# Patient Record
Sex: Male | Born: 1940
Health system: Southern US, Community
[De-identification: ages and names within clinical notes are randomized; demographics above are authoritative.]

## PROBLEM LIST (undated history)

## (undated) DIAGNOSIS — E119 Type 2 diabetes mellitus without complications: Secondary | ICD-10-CM

## (undated) DIAGNOSIS — Z720 Tobacco use: Secondary | ICD-10-CM

## (undated) DIAGNOSIS — E785 Hyperlipidemia, unspecified: Secondary | ICD-10-CM

## (undated) DIAGNOSIS — I219 Acute myocardial infarction, unspecified: Secondary | ICD-10-CM

## (undated) DIAGNOSIS — I739 Peripheral vascular disease, unspecified: Secondary | ICD-10-CM

## (undated) DIAGNOSIS — R1310 Dysphagia, unspecified: Secondary | ICD-10-CM

## (undated) DIAGNOSIS — I639 Cerebral infarction, unspecified: Secondary | ICD-10-CM

## (undated) DIAGNOSIS — I10 Essential (primary) hypertension: Secondary | ICD-10-CM

## (undated) HISTORY — DX: Tobacco use: Z72.0

## (undated) HISTORY — DX: Essential (primary) hypertension: I10

## (undated) HISTORY — PX: US ECHOCARDIOGRAPHY: HXRAD669

## (undated) HISTORY — DX: Peripheral vascular disease, unspecified: I73.9

## (undated) HISTORY — PX: MRI: SHX5353

## (undated) HISTORY — DX: Hyperlipidemia, unspecified: E78.5

## (undated) HISTORY — DX: Type 2 diabetes mellitus without complications: E11.9

## (undated) HISTORY — DX: Dysphagia, unspecified: R13.10

## (undated) HISTORY — DX: Cerebral infarction, unspecified: I63.9

---

## 1998-08-27 ENCOUNTER — Ambulatory Visit (HOSPITAL_COMMUNITY): Admission: RE | Admit: 1998-08-27 | Discharge: 1998-08-27 | Payer: Self-pay | Admitting: Cardiovascular Disease

## 1998-08-27 ENCOUNTER — Encounter: Payer: Self-pay | Admitting: Cardiovascular Disease

## 1998-08-31 ENCOUNTER — Ambulatory Visit: Admission: RE | Admit: 1998-08-31 | Discharge: 1998-08-31 | Payer: Self-pay | Admitting: Cardiovascular Disease

## 1998-10-16 ENCOUNTER — Encounter: Payer: Self-pay | Admitting: Cardiovascular Disease

## 1998-10-16 ENCOUNTER — Inpatient Hospital Stay (HOSPITAL_COMMUNITY): Admission: RE | Admit: 1998-10-16 | Discharge: 1998-10-17 | Payer: Self-pay | Admitting: Cardiovascular Disease

## 1998-10-22 ENCOUNTER — Ambulatory Visit (HOSPITAL_COMMUNITY): Admission: RE | Admit: 1998-10-22 | Discharge: 1998-10-22 | Payer: Self-pay | Admitting: Internal Medicine

## 1998-10-22 ENCOUNTER — Encounter: Payer: Self-pay | Admitting: Internal Medicine

## 1999-08-08 ENCOUNTER — Emergency Department (HOSPITAL_COMMUNITY): Admission: EM | Admit: 1999-08-08 | Discharge: 1999-08-08 | Payer: Self-pay | Admitting: Emergency Medicine

## 2000-07-18 ENCOUNTER — Encounter: Payer: Self-pay | Admitting: Emergency Medicine

## 2000-07-18 ENCOUNTER — Emergency Department (HOSPITAL_COMMUNITY): Admission: EM | Admit: 2000-07-18 | Discharge: 2000-07-18 | Payer: Self-pay | Admitting: Emergency Medicine

## 2000-08-04 ENCOUNTER — Encounter: Admission: RE | Admit: 2000-08-04 | Discharge: 2000-08-04 | Payer: Self-pay | Admitting: Internal Medicine

## 2002-03-22 ENCOUNTER — Emergency Department (HOSPITAL_COMMUNITY): Admission: EM | Admit: 2002-03-22 | Discharge: 2002-03-22 | Payer: Self-pay | Admitting: Emergency Medicine

## 2005-09-15 ENCOUNTER — Emergency Department (HOSPITAL_COMMUNITY): Admission: EM | Admit: 2005-09-15 | Discharge: 2005-09-15 | Payer: Self-pay | Admitting: Emergency Medicine

## 2005-11-13 ENCOUNTER — Ambulatory Visit (HOSPITAL_BASED_OUTPATIENT_CLINIC_OR_DEPARTMENT_OTHER): Admission: RE | Admit: 2005-11-13 | Discharge: 2005-11-13 | Payer: Self-pay | Admitting: Specialist

## 2008-09-26 DEATH — deceased

## 2010-06-22 ENCOUNTER — Emergency Department (HOSPITAL_COMMUNITY): Payer: Medicare Other

## 2010-06-22 ENCOUNTER — Inpatient Hospital Stay (HOSPITAL_COMMUNITY)
Admission: EM | Admit: 2010-06-22 | Discharge: 2010-06-24 | DRG: 066 | Discharge status: Against Medical Advice | Payer: Medicare Other | Attending: Internal Medicine | Admitting: Internal Medicine

## 2010-06-22 DIAGNOSIS — R29810 Facial weakness: Secondary | ICD-10-CM | POA: Diagnosis present

## 2010-06-22 DIAGNOSIS — R1310 Dysphagia, unspecified: Secondary | ICD-10-CM | POA: Diagnosis present

## 2010-06-22 DIAGNOSIS — I6350 Cerebral infarction due to unspecified occlusion or stenosis of unspecified cerebral artery: Principal | ICD-10-CM | POA: Diagnosis present

## 2010-06-22 DIAGNOSIS — E119 Type 2 diabetes mellitus without complications: Secondary | ICD-10-CM | POA: Diagnosis present

## 2010-06-22 DIAGNOSIS — F17200 Nicotine dependence, unspecified, uncomplicated: Secondary | ICD-10-CM | POA: Diagnosis present

## 2010-06-22 DIAGNOSIS — I10 Essential (primary) hypertension: Secondary | ICD-10-CM | POA: Diagnosis present

## 2010-06-22 DIAGNOSIS — Z7902 Long term (current) use of antithrombotics/antiplatelets: Secondary | ICD-10-CM

## 2010-06-22 DIAGNOSIS — I739 Peripheral vascular disease, unspecified: Secondary | ICD-10-CM | POA: Diagnosis present

## 2010-06-22 DIAGNOSIS — E785 Hyperlipidemia, unspecified: Secondary | ICD-10-CM | POA: Diagnosis present

## 2010-06-22 LAB — DIFFERENTIAL
Eosinophils Relative: 7 % — ABNORMAL HIGH (ref 0–5)
Lymphocytes Relative: 27 % (ref 12–46)

## 2010-06-22 LAB — BASIC METABOLIC PANEL
CO2: 27 mEq/L (ref 19–32)
Calcium: 10.1 mg/dL (ref 8.4–10.5)
Chloride: 103 mEq/L (ref 96–112)
Creatinine, Ser: 1.19 mg/dL (ref 0.4–1.5)
Potassium: 3.4 mEq/L — ABNORMAL LOW (ref 3.5–5.1)
Sodium: 140 mEq/L (ref 135–145)

## 2010-06-22 LAB — CBC
HCT: 47 % (ref 39.0–52.0)
Hemoglobin: 16.3 g/dL (ref 13.0–17.0)
MCHC: 34.7 g/dL (ref 30.0–36.0)
MCV: 82.1 fL (ref 78.0–100.0)
Platelets: 302 10*3/uL (ref 150–400)
RBC: 5.75 MIL/uL (ref 4.22–5.81)
RBC: 5.72 MIL/uL (ref 4.22–5.81)
RDW: 14.3 % (ref 11.5–15.5)
RDW: 14.3 % (ref 11.5–15.5)
WBC: 13.8 10*3/uL — ABNORMAL HIGH (ref 4.0–10.5)

## 2010-06-22 LAB — POCT CARDIAC MARKERS
CKMB, poc: 2.4 ng/mL (ref 1.0–8.0)
Myoglobin, poc: 148 ng/mL (ref 12–200)
Troponin i, poc: <0.05 ng/mL (ref 0.00–0.09)

## 2010-06-22 LAB — COMPREHENSIVE METABOLIC PANEL
ALT: 30 U/L (ref 0–53)
Alkaline Phosphatase: 45 U/L (ref 39–117)
Calcium: 9.7 mg/dL (ref 8.4–10.5)
Chloride: 102 mEq/L (ref 96–112)
Glucose, Bld: 109 mg/dL — ABNORMAL HIGH (ref 70–99)
Sodium: 137 mEq/L (ref 135–145)
Total Bilirubin: 0.5 mg/dL (ref 0.3–1.2)
Total Protein: 7.2 g/dL (ref 6.0–8.3)

## 2010-06-22 LAB — CK TOTAL AND CKMB (NOT AT ARMC)
Relative Index: 1.8 (ref 0.0–2.5)
Total CK: 148 U/L (ref 7–232)

## 2010-06-22 LAB — APTT: aPTT: 29 seconds (ref 24–37)

## 2010-06-23 ENCOUNTER — Inpatient Hospital Stay (HOSPITAL_COMMUNITY): Payer: Medicare Other

## 2010-06-23 LAB — LIPID PANEL
Cholesterol: 172 mg/dL (ref 0–200)
LDL Cholesterol: 87 mg/dL (ref 0–99)
Triglycerides: 308 mg/dL — ABNORMAL HIGH (ref <150)
VLDL: 62 mg/dL — ABNORMAL HIGH (ref 0–40)

## 2010-06-23 LAB — BASIC METABOLIC PANEL
Calcium: 9 mg/dL (ref 8.4–10.5)
Chloride: 103 mEq/L (ref 96–112)
GFR calc Af Amer: >60 mL/min (ref >60)
GFR calc non Af Amer: 58 mL/min — ABNORMAL LOW (ref >60)

## 2010-06-23 LAB — GLUCOSE, CAPILLARY
Glucose-Capillary: 237 mg/dL — ABNORMAL HIGH (ref 70–99)
Glucose-Capillary: 126 mg/dL — ABNORMAL HIGH (ref 70–99)
Glucose-Capillary: 124 mg/dL — ABNORMAL HIGH (ref 70–99)

## 2010-06-24 LAB — CBC
HCT: 46.9 % (ref 39.0–52.0)
Hemoglobin: 16 g/dL (ref 13.0–17.0)
MCH: 28.5 pg (ref 26.0–34.0)
MCHC: 34.1 g/dL (ref 30.0–36.0)
RBC: 5.62 MIL/uL (ref 4.22–5.81)

## 2010-06-24 LAB — BASIC METABOLIC PANEL
CO2: 25 mEq/L (ref 19–32)
Calcium: 9.4 mg/dL (ref 8.4–10.5)
Chloride: 105 mEq/L (ref 96–112)
Glucose, Bld: 149 mg/dL — ABNORMAL HIGH (ref 70–99)
Sodium: 137 mEq/L (ref 135–145)

## 2010-06-24 LAB — GLUCOSE, CAPILLARY: Glucose-Capillary: 153 mg/dL — ABNORMAL HIGH (ref 70–99)

## 2010-06-29 NOTE — Consult Note (Signed)
NAME:  Jared Young, Jared Young NO.:  [id number]  MEDICAL RECORD NO.:  [id number]           PATIENT TYPE:  I  LOCATION:  3015                         FACILITY:  [facility]  PHYSICIAN:  Thana Farr, MD    DATE OF BIRTH:  [birth year]  DATE OF CONSULTATION:  06/22/2010 DATE OF DISCHARGE:                                CONSULTATION   Consult called by Dr. Butler Denmark.  HISTORY:  Mr. Tones is a 70 year old male who reports awakening this morning and noting some slurred speech and left facial droop.  Also noted some funny sensation on the right side of his tongue and throat. He currently denies any right-sided symptoms and denied ever having them earlier today as well.  He reports that he was able to eat his breakfast and some lunch, but did note that things were leaking out of his mouth. He does not describe any numbness or weakness in the extremities.  The patient presented this afternoon.  Workup included an MRI of the brain that showed an acute frontoparietal cortical infarct.  MRA showed distal diffuse atherosclerosis.  Consult called for further recommendations. Patient is not considered a tPA candidate at this time due to time of onset.  PAST MEDICAL HISTORY:  Hypertension, hypercholesterolemia, peripheral vascular disease, diabetes.  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  He reports no history of alcohol or illicit drug abuse.  He is a smoker.  MEDICATIONS:  Acarbose, amlodipine, fenofibrate, fish oil, glimepiride, losartan, hydrochlorothiazide, metformin, and aspirin.  PHYSICAL EXAMINATION:  VITAL SIGNS:  Blood pressure 143/89, heart rate 68, respiratory rate 16, temperature 97.9. NEUROLOGIC:  On mental status testing, the patient is alert and oriented.  He can follow commands without difficulty.  Speech is dysarthric but fluent.  On cranial nerve testing II, disk flat bilaterally.  Visual fields are grossly intact.  III, IV, VI, extraocular  movements intact.  V and VII, left facial droop.  VIII, grossly intact.  IX and X decreased gag.  XI, bilateral shoulder shrug. XII, midline tongue extension.  On motor exam, the patient has a 5-/5 hand grip on the left and is 5-/5 on the left upper extremity diffusely. He is 5/5 on the right.  There is a pronator drift on the left.  The patient is 5/5 in the bilateral lower extremities.  On sensory testing, the patient reports intact pinprick and light touch bilaterally, but does seem to preferentially use the right as opposed to the left even though the patient reports that he is left handed.  There does seem to be some mild neglect.  Deep tendon reflexes are 1+ in the upper extremities, trace at the knees, and absent at the ankles.  Plantar is upgoing on the left and downgoing on the right.  On cerebellar testing, finger-to-nose and heel-to-shin intact.  LABORATORY DATA:  White blood cell count 13.8, platelet count 302, hemoglobin and hematocrit 16.3 and 47.0 respectively.  PT 13.6, INR 1.02, PTT 29.  Sodium 137, potassium 3.2, chloride 102, CO2 of 24, glucose 109, BUN 21, creatinine 1.17, bilirubin 0.5, alk phos 45, SGOT and  SGPT 23 and 30, total protein 7.2, albumin 3.9, calcium 9.7, hemoglobin A1c 7.7.  Troponin negative.  ASSESSMENT:  Mr. Bonaventure is a 70 year old male who has had an acute left right cortical infarction likely secondary to small vessel disease.  There does not seem to be any large vessel disease that would warrant aggressive intervention.  He is already taking an aspirin a day.  Echocardiogram and carotids are pending.  PLAN: 1. I agree with echo and carotids. 2. Would change aspirin to Plavix 75 mg p.o. daily. 3. A swallow eval. 4. May benefit from OT evaluation as well.          ______________________________ Thana Farr, MD     LR/MEDQ  D:  06/22/2010  T:  06/23/2010  Job:  [id number]  Electronically Signed by Thana Farr MD on 06/29/2010 01:46:24  PM

## 2010-07-04 NOTE — H&P (Signed)
NAME:  Jared Young, Jared Young NO.:  [id number]  MEDICAL RECORD NO.:  [id number]           PATIENT TYPE:  E  LOCATION:  [facility]                         FACILITY:  [facility]  PHYSICIAN:  Calvert Cantor, M.D.     DATE OF BIRTH:  1941-02-24  DATE OF ADMISSION:  06/22/2010 DATE OF DISCHARGE:                             HISTORY & PHYSICAL   PRIMARY CARE PHYSICIAN:  Georgianne Fick, MD  PRESENTING COMPLAINT:  Slurred speech.  HISTORY OF PRESENT ILLNESS:  This is a 70 year old male with hypertension, hyperlipidemia, diabetes, and peripheral vascular disease who is also a smoker.  The patient woke up this morning and noted slurred speech, left facial droop and a funny sensation on the right side of his tongue and his throat.  He was able to eat his breakfast, the only issue was that it was leaking out of his mouth.  The patient does not describe any other neurological symptoms in addition to these. He had no trouble with his vision and no trouble ambulating.  PAST MEDICAL HISTORY: 1. Hypertension. 2. High cholesterol. 3. Peripheral vascular disease. 4. Non-insulin dependent diabetes mellitus. 5. Smoker.  PAST SURGICAL HISTORY: 1. Stents in both legs in 2000. 2. Back surgery.  SOCIAL HISTORY:  Smokes a pack a day, has been smoking since age 73, does not drink.  He drive a delivery truck.  He is married.  No drug abuse.  ALLERGIES:  He is unable to tolerate statins because pain in his legs.  MEDICATIONS:  Still awaiting med rec, but according to the ER list he is on the following; 1. Acarbose. 2. Amlodipine. 3. Fenofibrate. 4. Fish oil. 5. Glimepiride. 6. Losartan/HCTZ. 7. Metformin.  When asked about aspirin, he admits that he takes a full-dose aspirin daily.  REVIEW OF SYSTEMS:  The patient has been dieting these past 3 weeks and has lost about 15-16 pounds.  No lethargy.  No fever, chills, or sweats. No complaint of frequent headaches.  No blurred vision or  double vision. No sore throat.  He has lot of sinus drainage.  No earache. RESPIRATORY:  No cough or shortness of breath.  CARDIAC:  No chest pain, palpitations, or pedal edema.  GASTROINTESTINAL:  No nausea, vomiting, diarrhea, or constipation.  GENITOURINARY:  No dysuria or hematuria. HEMATOLOGIC:  Bruises easily.  SKIN:  No rash.  MUSCULOSKELETAL:  No joint pain or back pain.  NEUROLOGIC:  Per HPI.  No history of stroke or seizure in the past.  PSYCHOLOGIC:  No anxiety or depression.  PHYSICAL EXAMINATION:  GENERAL:  Elderly man sitting up in bed in no acute distress. VITAL SIGNS:  Blood pressure 116/66, pulse 83, respiratory rate 20, temperature 97.8, and oxygen saturation 99%.  No rales. HEENT:  Pupils equal, round, and reacting to light.  Extraocular movements are intact.  Conjunctivae is pink.  No scleral icterus.  Oral mucosa is moist.  Oropharynx is clear. NECK:  Supple.  No thyromegaly, lymphadenopathy, or carotid bruits. HEART:  Regular rate and rhythm.  No murmurs, rubs, or gallops. LUNGS:  Clear bilaterally.  Normal respiratory effort.  No use of  accessory muscles. ABDOMEN:  Soft, nontender, nondistended.  Bowel sounds positive.  No organomegaly. EXTREMITIES:  No cyanosis, clubbing, or edema.  Pedal pulses are not palpable. NEUROLOGIC:  The patient has a left facial droop.  He has normal sensation on both sides of his face.  He is able to elevate his palate appropriately and move his tongue appropriately.  All other cranial nerves are intact.  Strength is 5/5 in all 4 extremities. PSYCHOLOGIC:  Awake, alert, and oriented x3.  Mood and affect normal. SKIN:  Warm and dry.  No rash, but he does have bruises.  LABORATORY DATA:  WBC count, slightly elevated at 12.3.  First set of cardiac enzymes negative.  Metabolic panel reveals a mildly low potassium at 3.4 and glucose is slightly elevated at 106.  IMAGING DATA: 1. Chest x-ray does not reveal any acute pulmonary  abnormalities.     There is multilevel disk space narrowing and spur formation.  There     is mild coarse interstitial markings bilaterally suggestive of     COPD. 2. CT of the head without contrast reveals an old lacunar infarct     involving the right caudate nucleus and mild chronic small vessel     disease. 3. EKG reveals a sinus rhythm at 72 beats per minute with right axis     deviation.  ASSESSMENT/PLAN: 1. Cerebrovascular accident with left facial droop, slurred speech,     and paresthesias on the right side of his tongue and throat.  He     tells me he was able to swallow okay, but will get proper speech     eval.  We will get an MRI of his brain, carotid Dopplers, 2-D echo,     check his lipids, and A1c.  I will switch his aspirin to Plavix.     The ER doctor, Dr. Dierdre Highman has already spoken with Dr. Thad Ranger, the     neurologist on call who will come and evaluate him as well. 2. Smoker.  He has been counseled to quit.  We will order a nicotine     patch for him. 3. Hypertension.  We will place holding parameters on his medications. 4. Hyperlipidemia. 5. Diabetes mellitus. 6. Peripheral vascular disease.  The patient would like to be a no code.  Time on admission was 60 minutes.     Calvert Cantor, M.D.     SR/MEDQ  D:  06/22/2010  T:  06/22/2010  Job:  [id number]  cc:   Georgianne Fick, M.D.  Electronically Signed by Calvert Cantor M.D. on 07/03/2010 12:23:19 PM

## 2010-07-10 NOTE — Discharge Summary (Signed)
NAME:  KHYRE, GERMOND NO.:  [id number]  MEDICAL RECORD NO.:  [id number]           PATIENT TYPE:  I  LOCATION:  3015                         FACILITY:  [facility]  PHYSICIAN:  Thad Ranger, MD       DATE OF BIRTH:  [birth year]  DATE OF ADMISSION:  06/22/2010 DATE OF DISCHARGE:  06/24/2010                              DISCHARGE SUMMARY   PLEASE NOTE THAT THE PATIENT LEFT AGAINST MEDICAL ADVICE ON June 24, 2010.  DISCHARGE DIAGNOSES: 1. Acute right frontoparietal cortical infarction/cerebrovascular     accident. 2. Dysphagia severe secondary to the stroke. 3. Nicotine abuse. 4. Hypertension. 5. Hyperlipidemia. 6. Diabetes mellitus. 7. Peripheral vascular disease.  CONSULTATIONS:  Neurology, Dr. Pearlean Brownie.  BRIEF HISTORY OF PRESENT ILLNESS:  Mr. Nan is a 70 year old male with hypertension, hyperlipidemia, diabetes, peripheral vascular disease, nicotine dependence, woke up on the day of admission and noted slurred speech, left facial droop, and funny sensation on the right side of the tongue and throat.  He was able to eat his breakfast; however, it was leaking out of his mouth.  The patient presented for further workup to the [facility].  RADIOLOGICAL DATA:  Chest x-ray, June 22, 2010, no acute cardiopulmonary abnormalities.  CT of head without contrast, June 22, 2010, no acute intracranial abnormality stable, mild chronic small- vessel disease and old right caudate lacunar.  MRI of the head showed acute infarction in the right frontoparietal cortical region.  No signs of hemorrhage or significant swelling/mass effect.  MRA of the brain showed no major vessel occlusion of critical proximal stenosis, atherosclerotic, irregularity of the more distal intracranial branch vessels diffusely.  BRIEF HOSPITALIZATION COURSE:  Mr. Amezcua is a 70 year old male with a history of hypertension, hyperlipidemia, peripheral vascular disease, diabetes  mellitus, nicotine dependence, presented with slurring of speech with left facial droop.  The patient was found to have acute CVA in the right frontoparietal region.  Neurology was consulted and the patient was placed on stroke pathway.  MRI and MRA of his brain was done, which confirmed the acute right frontoparietal CVA.  Since he was on aspirin, he was started on Plavix per Neurology recommendation as well as the statins.  He also had severe dysphagia secondary to the acute stroke and per speech evaluation and study, the patient qualified for dysphagia I diet with nectar-thick liquids.  The patient was extremely irritated with the dysphagia diet.  At my extensive counseling, he did undergo echocardiogram, which showed EF of 55-65%, possibility of regional wall motion abnormality cannot be excluded, grade 1 diastolic dysfunction.  No cardiac source of emboli was identified.  The patient was seen by Neurology Service and also did recommend TEE.  The patient refused to stay in the hospital for any further testing, although we did recommend him strongly to undergo carotid Doppler and repeat evaluation from speech therapy.  The patient stated that he did not agree with the dysphagia I diet.  I counseled him strongly that if he does not comply with his diet, he can have the pneumonia secondary to aspiration.  The patient was  also strongly counseled to quit smoking.  The patient appears to be extremely noncompliant with adherence to the medications and the counseling.  The patient signed and left against medical advice.  I still gave him the Plavix prescription due to the acute stroke.  I strongly recommended him to follow up with his PCP, Dr. Nicholos Johns, with that.  PHYSICAL EXAMINATION:  VITAL SIGNS:  At the time of my examination this morning, temperature 98.2, pulse 64, respirations 18, blood pressure 118/71, O2 sats 93% on room air. GENERAL:  The patient extremely irritable, otherwise  oriented x3. Obvious dysarthria and slurring. NECK:  Supple.  No lymphadenopathy.  No carotid bruits. CVS:  S1 and S2 clear. LUNGS:  Clear to auscultation. ABDOMEN:  Soft, nontender, and nondistended.  Normal bowel sounds. EXTREMITIES:  No cyanosis, clubbing, or edema. NEUROLOGICAL:  Left facial droop still persistent, although the patient was moving all four extremities.  He did not cooperate with full exam secondary to his irritable behavior.     Thad Ranger, MD     RR/MEDQ  D:  06/24/2010  T:  06/24/2010  Job:  [id number]  cc:   Georgianne Fick, M.D. Pramod P. Pearlean Brownie, MD  Electronically Signed by Andres Labrum RAI  on 07/10/2010 07:44:14 AM

## 2013-08-04 ENCOUNTER — Encounter: Payer: Self-pay | Admitting: *Deleted

## 2015-01-11 DIAGNOSIS — I10 Essential (primary) hypertension: Secondary | ICD-10-CM | POA: Diagnosis not present

## 2015-01-11 DIAGNOSIS — E1165 Type 2 diabetes mellitus with hyperglycemia: Secondary | ICD-10-CM | POA: Diagnosis not present

## 2015-01-11 DIAGNOSIS — E782 Mixed hyperlipidemia: Secondary | ICD-10-CM | POA: Diagnosis not present

## 2015-01-11 DIAGNOSIS — E1121 Type 2 diabetes mellitus with diabetic nephropathy: Secondary | ICD-10-CM | POA: Diagnosis not present

## 2015-02-27 DIAGNOSIS — Z0000 Encounter for general adult medical examination without abnormal findings: Secondary | ICD-10-CM | POA: Diagnosis not present

## 2015-02-27 DIAGNOSIS — I10 Essential (primary) hypertension: Secondary | ICD-10-CM | POA: Diagnosis not present

## 2015-02-27 DIAGNOSIS — Z125 Encounter for screening for malignant neoplasm of prostate: Secondary | ICD-10-CM | POA: Diagnosis not present

## 2015-03-06 DIAGNOSIS — R000 Tachycardia, unspecified: Secondary | ICD-10-CM | POA: Diagnosis not present

## 2015-03-06 DIAGNOSIS — Z0000 Encounter for general adult medical examination without abnormal findings: Secondary | ICD-10-CM | POA: Diagnosis not present

## 2015-03-06 DIAGNOSIS — R0602 Shortness of breath: Secondary | ICD-10-CM | POA: Diagnosis not present

## 2015-03-06 DIAGNOSIS — E782 Mixed hyperlipidemia: Secondary | ICD-10-CM | POA: Diagnosis not present

## 2015-03-06 DIAGNOSIS — I10 Essential (primary) hypertension: Secondary | ICD-10-CM | POA: Diagnosis not present

## 2015-03-23 DIAGNOSIS — E119 Type 2 diabetes mellitus without complications: Secondary | ICD-10-CM | POA: Diagnosis not present

## 2015-03-23 DIAGNOSIS — H5203 Hypermetropia, bilateral: Secondary | ICD-10-CM | POA: Diagnosis not present

## 2015-03-23 DIAGNOSIS — Z961 Presence of intraocular lens: Secondary | ICD-10-CM | POA: Diagnosis not present

## 2015-03-23 DIAGNOSIS — H52222 Regular astigmatism, left eye: Secondary | ICD-10-CM | POA: Diagnosis not present

## 2015-04-03 DIAGNOSIS — E782 Mixed hyperlipidemia: Secondary | ICD-10-CM | POA: Diagnosis not present

## 2015-04-03 DIAGNOSIS — R000 Tachycardia, unspecified: Secondary | ICD-10-CM | POA: Diagnosis not present

## 2015-04-03 DIAGNOSIS — I10 Essential (primary) hypertension: Secondary | ICD-10-CM | POA: Diagnosis not present

## 2015-06-19 DIAGNOSIS — Z87898 Personal history of other specified conditions: Secondary | ICD-10-CM | POA: Diagnosis not present

## 2015-06-19 DIAGNOSIS — E782 Mixed hyperlipidemia: Secondary | ICD-10-CM | POA: Diagnosis not present

## 2015-06-19 DIAGNOSIS — I10 Essential (primary) hypertension: Secondary | ICD-10-CM | POA: Diagnosis not present

## 2015-06-19 DIAGNOSIS — L918 Other hypertrophic disorders of the skin: Secondary | ICD-10-CM | POA: Diagnosis not present

## 2015-06-19 DIAGNOSIS — E1121 Type 2 diabetes mellitus with diabetic nephropathy: Secondary | ICD-10-CM | POA: Diagnosis not present

## 2015-06-19 DIAGNOSIS — E1151 Type 2 diabetes mellitus with diabetic peripheral angiopathy without gangrene: Secondary | ICD-10-CM | POA: Diagnosis not present

## 2015-08-01 DIAGNOSIS — K5732 Diverticulitis of large intestine without perforation or abscess without bleeding: Secondary | ICD-10-CM | POA: Diagnosis not present

## 2015-08-01 DIAGNOSIS — N390 Urinary tract infection, site not specified: Secondary | ICD-10-CM | POA: Diagnosis not present

## 2015-09-13 DIAGNOSIS — M109 Gout, unspecified: Secondary | ICD-10-CM | POA: Diagnosis not present

## 2015-09-18 DIAGNOSIS — M109 Gout, unspecified: Secondary | ICD-10-CM | POA: Diagnosis not present

## 2016-01-18 DIAGNOSIS — E1121 Type 2 diabetes mellitus with diabetic nephropathy: Secondary | ICD-10-CM | POA: Diagnosis not present

## 2016-01-18 DIAGNOSIS — E782 Mixed hyperlipidemia: Secondary | ICD-10-CM | POA: Diagnosis not present

## 2016-01-18 DIAGNOSIS — E1165 Type 2 diabetes mellitus with hyperglycemia: Secondary | ICD-10-CM | POA: Diagnosis not present

## 2016-01-18 DIAGNOSIS — I10 Essential (primary) hypertension: Secondary | ICD-10-CM | POA: Diagnosis not present

## 2016-01-29 DIAGNOSIS — E1151 Type 2 diabetes mellitus with diabetic peripheral angiopathy without gangrene: Secondary | ICD-10-CM | POA: Diagnosis not present

## 2016-01-29 DIAGNOSIS — E1121 Type 2 diabetes mellitus with diabetic nephropathy: Secondary | ICD-10-CM | POA: Diagnosis not present

## 2016-01-29 DIAGNOSIS — N182 Chronic kidney disease, stage 2 (mild): Secondary | ICD-10-CM | POA: Diagnosis not present

## 2016-02-05 DIAGNOSIS — N182 Chronic kidney disease, stage 2 (mild): Secondary | ICD-10-CM | POA: Diagnosis not present

## 2016-02-05 DIAGNOSIS — E1121 Type 2 diabetes mellitus with diabetic nephropathy: Secondary | ICD-10-CM | POA: Diagnosis not present

## 2016-02-05 DIAGNOSIS — E782 Mixed hyperlipidemia: Secondary | ICD-10-CM | POA: Diagnosis not present

## 2016-03-03 DIAGNOSIS — E1165 Type 2 diabetes mellitus with hyperglycemia: Secondary | ICD-10-CM | POA: Diagnosis not present

## 2016-04-14 DIAGNOSIS — E1165 Type 2 diabetes mellitus with hyperglycemia: Secondary | ICD-10-CM | POA: Diagnosis not present

## 2016-04-14 DIAGNOSIS — I10 Essential (primary) hypertension: Secondary | ICD-10-CM | POA: Diagnosis not present

## 2016-04-14 DIAGNOSIS — N182 Chronic kidney disease, stage 2 (mild): Secondary | ICD-10-CM | POA: Diagnosis not present

## 2016-07-03 DIAGNOSIS — Z0000 Encounter for general adult medical examination without abnormal findings: Secondary | ICD-10-CM | POA: Diagnosis not present

## 2016-07-03 DIAGNOSIS — E782 Mixed hyperlipidemia: Secondary | ICD-10-CM | POA: Diagnosis not present

## 2016-07-03 DIAGNOSIS — N182 Chronic kidney disease, stage 2 (mild): Secondary | ICD-10-CM | POA: Diagnosis not present

## 2016-07-03 DIAGNOSIS — E1121 Type 2 diabetes mellitus with diabetic nephropathy: Secondary | ICD-10-CM | POA: Diagnosis not present

## 2016-07-10 DIAGNOSIS — N182 Chronic kidney disease, stage 2 (mild): Secondary | ICD-10-CM | POA: Diagnosis not present

## 2016-07-10 DIAGNOSIS — E782 Mixed hyperlipidemia: Secondary | ICD-10-CM | POA: Diagnosis not present

## 2016-07-10 DIAGNOSIS — E1121 Type 2 diabetes mellitus with diabetic nephropathy: Secondary | ICD-10-CM | POA: Diagnosis not present

## 2016-07-10 DIAGNOSIS — E1165 Type 2 diabetes mellitus with hyperglycemia: Secondary | ICD-10-CM | POA: Diagnosis not present

## 2016-10-13 DIAGNOSIS — I10 Essential (primary) hypertension: Secondary | ICD-10-CM | POA: Diagnosis not present

## 2016-10-13 DIAGNOSIS — E1165 Type 2 diabetes mellitus with hyperglycemia: Secondary | ICD-10-CM | POA: Diagnosis not present

## 2016-10-13 DIAGNOSIS — E1121 Type 2 diabetes mellitus with diabetic nephropathy: Secondary | ICD-10-CM | POA: Diagnosis not present

## 2016-10-13 DIAGNOSIS — E782 Mixed hyperlipidemia: Secondary | ICD-10-CM | POA: Diagnosis not present

## 2016-10-13 DIAGNOSIS — E1151 Type 2 diabetes mellitus with diabetic peripheral angiopathy without gangrene: Secondary | ICD-10-CM | POA: Diagnosis not present

## 2017-02-26 DIAGNOSIS — E1151 Type 2 diabetes mellitus with diabetic peripheral angiopathy without gangrene: Secondary | ICD-10-CM | POA: Diagnosis not present

## 2017-02-26 DIAGNOSIS — E782 Mixed hyperlipidemia: Secondary | ICD-10-CM | POA: Diagnosis not present

## 2017-02-26 DIAGNOSIS — N182 Chronic kidney disease, stage 2 (mild): Secondary | ICD-10-CM | POA: Diagnosis not present

## 2017-02-26 DIAGNOSIS — E1165 Type 2 diabetes mellitus with hyperglycemia: Secondary | ICD-10-CM | POA: Diagnosis not present

## 2017-03-05 DIAGNOSIS — I10 Essential (primary) hypertension: Secondary | ICD-10-CM | POA: Diagnosis not present

## 2017-03-05 DIAGNOSIS — E1165 Type 2 diabetes mellitus with hyperglycemia: Secondary | ICD-10-CM | POA: Diagnosis not present

## 2017-03-05 DIAGNOSIS — E1151 Type 2 diabetes mellitus with diabetic peripheral angiopathy without gangrene: Secondary | ICD-10-CM | POA: Diagnosis not present

## 2017-03-05 DIAGNOSIS — E1121 Type 2 diabetes mellitus with diabetic nephropathy: Secondary | ICD-10-CM | POA: Diagnosis not present

## 2017-03-05 DIAGNOSIS — E782 Mixed hyperlipidemia: Secondary | ICD-10-CM | POA: Diagnosis not present

## 2017-07-22 DIAGNOSIS — E1165 Type 2 diabetes mellitus with hyperglycemia: Secondary | ICD-10-CM | POA: Diagnosis not present

## 2017-07-22 DIAGNOSIS — E1121 Type 2 diabetes mellitus with diabetic nephropathy: Secondary | ICD-10-CM | POA: Diagnosis not present

## 2017-07-22 DIAGNOSIS — Z0000 Encounter for general adult medical examination without abnormal findings: Secondary | ICD-10-CM | POA: Diagnosis not present

## 2017-07-22 DIAGNOSIS — I10 Essential (primary) hypertension: Secondary | ICD-10-CM | POA: Diagnosis not present

## 2017-07-22 DIAGNOSIS — E782 Mixed hyperlipidemia: Secondary | ICD-10-CM | POA: Diagnosis not present

## 2017-07-29 DIAGNOSIS — E782 Mixed hyperlipidemia: Secondary | ICD-10-CM | POA: Diagnosis not present

## 2017-07-29 DIAGNOSIS — E1165 Type 2 diabetes mellitus with hyperglycemia: Secondary | ICD-10-CM | POA: Diagnosis not present

## 2017-07-29 DIAGNOSIS — I739 Peripheral vascular disease, unspecified: Secondary | ICD-10-CM | POA: Diagnosis not present

## 2017-07-29 DIAGNOSIS — E1151 Type 2 diabetes mellitus with diabetic peripheral angiopathy without gangrene: Secondary | ICD-10-CM | POA: Diagnosis not present

## 2017-07-29 DIAGNOSIS — E1121 Type 2 diabetes mellitus with diabetic nephropathy: Secondary | ICD-10-CM | POA: Diagnosis not present

## 2017-08-06 DIAGNOSIS — I739 Peripheral vascular disease, unspecified: Secondary | ICD-10-CM | POA: Diagnosis not present

## 2017-08-06 DIAGNOSIS — I743 Embolism and thrombosis of arteries of the lower extremities: Secondary | ICD-10-CM | POA: Diagnosis not present

## 2017-08-07 DIAGNOSIS — I10 Essential (primary) hypertension: Secondary | ICD-10-CM | POA: Diagnosis not present

## 2017-08-07 DIAGNOSIS — E782 Mixed hyperlipidemia: Secondary | ICD-10-CM | POA: Diagnosis not present

## 2017-08-07 DIAGNOSIS — E1165 Type 2 diabetes mellitus with hyperglycemia: Secondary | ICD-10-CM | POA: Diagnosis not present

## 2017-08-07 DIAGNOSIS — E1121 Type 2 diabetes mellitus with diabetic nephropathy: Secondary | ICD-10-CM | POA: Diagnosis not present

## 2018-02-03 DIAGNOSIS — E1151 Type 2 diabetes mellitus with diabetic peripheral angiopathy without gangrene: Secondary | ICD-10-CM | POA: Diagnosis not present

## 2018-02-03 DIAGNOSIS — E782 Mixed hyperlipidemia: Secondary | ICD-10-CM | POA: Diagnosis not present

## 2018-02-05 DIAGNOSIS — Z23 Encounter for immunization: Secondary | ICD-10-CM | POA: Diagnosis not present

## 2018-02-05 DIAGNOSIS — I10 Essential (primary) hypertension: Secondary | ICD-10-CM | POA: Diagnosis not present

## 2018-02-05 DIAGNOSIS — E782 Mixed hyperlipidemia: Secondary | ICD-10-CM | POA: Diagnosis not present

## 2018-02-05 DIAGNOSIS — E1165 Type 2 diabetes mellitus with hyperglycemia: Secondary | ICD-10-CM | POA: Diagnosis not present

## 2018-02-10 DIAGNOSIS — I10 Essential (primary) hypertension: Secondary | ICD-10-CM | POA: Diagnosis not present

## 2018-02-10 DIAGNOSIS — E1165 Type 2 diabetes mellitus with hyperglycemia: Secondary | ICD-10-CM | POA: Diagnosis not present

## 2018-02-10 DIAGNOSIS — Z789 Other specified health status: Secondary | ICD-10-CM | POA: Diagnosis not present

## 2018-02-10 DIAGNOSIS — I447 Left bundle-branch block, unspecified: Secondary | ICD-10-CM | POA: Diagnosis not present

## 2018-02-10 DIAGNOSIS — I200 Unstable angina: Secondary | ICD-10-CM | POA: Diagnosis not present

## 2018-02-10 DIAGNOSIS — E782 Mixed hyperlipidemia: Secondary | ICD-10-CM | POA: Diagnosis not present

## 2018-02-10 DIAGNOSIS — I739 Peripheral vascular disease, unspecified: Secondary | ICD-10-CM | POA: Diagnosis not present

## 2018-02-10 DIAGNOSIS — R072 Precordial pain: Secondary | ICD-10-CM | POA: Diagnosis not present

## 2018-02-24 DIAGNOSIS — L858 Other specified epidermal thickening: Secondary | ICD-10-CM | POA: Diagnosis not present

## 2018-05-19 ENCOUNTER — Inpatient Hospital Stay (HOSPITAL_COMMUNITY)
Admission: EM | Admit: 2018-05-19 | Discharge: 2018-05-19 | DRG: 190 | Discharge status: Against Medical Advice | Payer: Medicare Other | Attending: Family Medicine | Admitting: Family Medicine

## 2018-05-19 ENCOUNTER — Encounter (HOSPITAL_COMMUNITY): Payer: Self-pay

## 2018-05-19 ENCOUNTER — Other Ambulatory Visit: Payer: Self-pay

## 2018-05-19 ENCOUNTER — Emergency Department (HOSPITAL_COMMUNITY): Payer: Medicare Other

## 2018-05-19 DIAGNOSIS — E119 Type 2 diabetes mellitus without complications: Secondary | ICD-10-CM | POA: Diagnosis not present

## 2018-05-19 DIAGNOSIS — J181 Lobar pneumonia, unspecified organism: Secondary | ICD-10-CM | POA: Diagnosis not present

## 2018-05-19 DIAGNOSIS — I447 Left bundle-branch block, unspecified: Secondary | ICD-10-CM

## 2018-05-19 DIAGNOSIS — J9601 Acute respiratory failure with hypoxia: Secondary | ICD-10-CM | POA: Diagnosis present

## 2018-05-19 DIAGNOSIS — F4321 Adjustment disorder with depressed mood: Secondary | ICD-10-CM | POA: Diagnosis present

## 2018-05-19 DIAGNOSIS — J9621 Acute and chronic respiratory failure with hypoxia: Secondary | ICD-10-CM | POA: Diagnosis not present

## 2018-05-19 DIAGNOSIS — E785 Hyperlipidemia, unspecified: Secondary | ICD-10-CM | POA: Diagnosis present

## 2018-05-19 DIAGNOSIS — I69391 Dysphagia following cerebral infarction: Secondary | ICD-10-CM

## 2018-05-19 DIAGNOSIS — J9611 Chronic respiratory failure with hypoxia: Secondary | ICD-10-CM | POA: Diagnosis not present

## 2018-05-19 DIAGNOSIS — I509 Heart failure, unspecified: Secondary | ICD-10-CM | POA: Diagnosis not present

## 2018-05-19 DIAGNOSIS — Z833 Family history of diabetes mellitus: Secondary | ICD-10-CM

## 2018-05-19 DIAGNOSIS — Z7984 Long term (current) use of oral hypoglycemic drugs: Secondary | ICD-10-CM

## 2018-05-19 DIAGNOSIS — Z5329 Procedure and treatment not carried out because of patient's decision for other reasons: Secondary | ICD-10-CM | POA: Diagnosis present

## 2018-05-19 DIAGNOSIS — I110 Hypertensive heart disease with heart failure: Secondary | ICD-10-CM | POA: Diagnosis not present

## 2018-05-19 DIAGNOSIS — I252 Old myocardial infarction: Secondary | ICD-10-CM | POA: Diagnosis not present

## 2018-05-19 DIAGNOSIS — R0602 Shortness of breath: Secondary | ICD-10-CM | POA: Diagnosis not present

## 2018-05-19 DIAGNOSIS — R000 Tachycardia, unspecified: Secondary | ICD-10-CM | POA: Diagnosis not present

## 2018-05-19 DIAGNOSIS — Z7902 Long term (current) use of antithrombotics/antiplatelets: Secondary | ICD-10-CM

## 2018-05-19 DIAGNOSIS — Z634 Disappearance and death of family member: Secondary | ICD-10-CM

## 2018-05-19 DIAGNOSIS — J441 Chronic obstructive pulmonary disease with (acute) exacerbation: Secondary | ICD-10-CM | POA: Diagnosis not present

## 2018-05-19 DIAGNOSIS — R296 Repeated falls: Secondary | ICD-10-CM | POA: Diagnosis not present

## 2018-05-19 DIAGNOSIS — Z9181 History of falling: Secondary | ICD-10-CM

## 2018-05-19 DIAGNOSIS — R0603 Acute respiratory distress: Secondary | ICD-10-CM

## 2018-05-19 DIAGNOSIS — I10 Essential (primary) hypertension: Secondary | ICD-10-CM | POA: Diagnosis not present

## 2018-05-19 DIAGNOSIS — E1151 Type 2 diabetes mellitus with diabetic peripheral angiopathy without gangrene: Secondary | ICD-10-CM | POA: Diagnosis not present

## 2018-05-19 DIAGNOSIS — J90 Pleural effusion, not elsewhere classified: Secondary | ICD-10-CM | POA: Diagnosis not present

## 2018-05-19 DIAGNOSIS — F17210 Nicotine dependence, cigarettes, uncomplicated: Secondary | ICD-10-CM | POA: Diagnosis present

## 2018-05-19 DIAGNOSIS — J440 Chronic obstructive pulmonary disease with acute lower respiratory infection: Principal | ICD-10-CM | POA: Diagnosis present

## 2018-05-19 DIAGNOSIS — Z79899 Other long term (current) drug therapy: Secondary | ICD-10-CM

## 2018-05-19 DIAGNOSIS — H9190 Unspecified hearing loss, unspecified ear: Secondary | ICD-10-CM | POA: Diagnosis not present

## 2018-05-19 HISTORY — DX: Acute myocardial infarction, unspecified: I21.9

## 2018-05-19 LAB — CBC WITH DIFFERENTIAL/PLATELET
ABS IMMATURE GRANULOCYTES: 0.12 10*3/uL — AB (ref 0.00–0.07)
BASOS PCT: 2 %
Basophils Absolute: 0.1 10*3/uL (ref 0.0–0.1)
EOS ABS: 0.2 10*3/uL (ref 0.0–0.5)
Eosinophils Relative: 3 %
HCT: 38.3 % — ABNORMAL LOW (ref 39.0–52.0)
Hemoglobin: 13.6 g/dL (ref 13.0–17.0)
Immature Granulocytes: 2 %
Lymphocytes Relative: 6 %
Lymphs Abs: 0.4 10*3/uL — ABNORMAL LOW (ref 0.7–4.0)
MCH: 29.4 pg (ref 26.0–34.0)
MCHC: 35.5 g/dL (ref 30.0–36.0)
MCV: 82.9 fL (ref 80.0–100.0)
MONO ABS: 0.6 10*3/uL (ref 0.1–1.0)
MONOS PCT: 9 %
NEUTROS ABS: 5.1 10*3/uL (ref 1.7–7.7)
NEUTROS PCT: 78 %
PLATELETS: 153 10*3/uL (ref 150–400)
RBC: 4.62 MIL/uL (ref 4.22–5.81)
RDW: 14.1 % (ref 11.5–15.5)
WBC: 6.6 10*3/uL (ref 4.0–10.5)
nRBC: 12.6 % — ABNORMAL HIGH (ref 0.0–0.2)

## 2018-05-19 LAB — COMPREHENSIVE METABOLIC PANEL
ALT: 11 U/L (ref 0–44)
ANION GAP: 13 (ref 5–15)
AST: 16 U/L (ref 15–41)
Albumin: 2.9 g/dL — ABNORMAL LOW (ref 3.5–5.0)
Alkaline Phosphatase: 79 U/L (ref 38–126)
BILIRUBIN TOTAL: 0.8 mg/dL (ref 0.3–1.2)
BUN: 17 mg/dL (ref 8–23)
CHLORIDE: 100 mmol/L (ref 98–111)
CO2: 25 mmol/L (ref 22–32)
Calcium: 9.1 mg/dL (ref 8.9–10.3)
Creatinine, Ser: 1.44 mg/dL — ABNORMAL HIGH (ref 0.61–1.24)
GFR, EST AFRICAN AMERICAN: 54 mL/min — AB (ref >60)
GFR, EST NON AFRICAN AMERICAN: 46 mL/min — AB (ref >60)
Glucose, Bld: 216 mg/dL — ABNORMAL HIGH (ref 70–99)
POTASSIUM: 4.1 mmol/L (ref 3.5–5.1)
Sodium: 138 mmol/L (ref 135–145)
TOTAL PROTEIN: 6.5 g/dL (ref 6.5–8.1)

## 2018-05-19 LAB — POCT I-STAT EG7
Acid-Base Excess: 4 mmol/L — ABNORMAL HIGH (ref 0.0–2.0)
Bicarbonate: 32.8 mmol/L — ABNORMAL HIGH (ref 20.0–28.0)
CALCIUM ION: 1.24 mmol/L (ref 1.15–1.40)
HEMATOCRIT: 63 % — AB (ref 39.0–52.0)
HEMOGLOBIN: 21.4 g/dL — AB (ref 13.0–17.0)
O2 SAT: 25 %
PH VEN: 7.323 (ref 7.250–7.430)
Potassium: 3.8 mmol/L (ref 3.5–5.1)
SODIUM: 139 mmol/L (ref 135–145)
TCO2: 35 mmol/L — ABNORMAL HIGH (ref 22–32)
pCO2, Ven: 63.3 mmHg — ABNORMAL HIGH (ref 44.0–60.0)
pO2, Ven: 19 mmHg — CL (ref 32.0–45.0)

## 2018-05-19 LAB — BRAIN NATRIURETIC PEPTIDE: B NATRIURETIC PEPTIDE 5: 73.1 pg/mL (ref 0.0–100.0)

## 2018-05-19 LAB — TROPONIN I: TROPONIN I: 0.05 ng/mL — AB (ref <0.03)

## 2018-05-19 MED ORDER — ENOXAPARIN SODIUM 40 MG/0.4ML ~~LOC~~ SOLN: 40.0000 mg | SUBCUTANEOUS | Status: DC

## 2018-05-19 MED ORDER — ASPIRIN EC 81 MG PO TBEC: 81.0000 mg | DELAYED_RELEASE_TABLET | Freq: Every day | ORAL | Status: DC

## 2018-05-19 MED ORDER — ACETAMINOPHEN 325 MG PO TABS: 650.0000 mg | ORAL_TABLET | Freq: Four times a day (QID) | ORAL | Status: DC | PRN

## 2018-05-19 MED ORDER — IPRATROPIUM-ALBUTEROL 0.5-2.5 (3) MG/3ML IN SOLN
3.0000 mL | Freq: Once | RESPIRATORY_TRACT | Status: AC
  Administered 2018-05-19: 3 mL via RESPIRATORY_TRACT
  Filled 2018-05-19: qty 3

## 2018-05-19 MED ORDER — INSULIN ASPART 100 UNIT/ML ~~LOC~~ SOLN: 0.0000 [IU] | Freq: Every day | SUBCUTANEOUS | Status: DC

## 2018-05-19 MED ORDER — FUROSEMIDE 20 MG PO TABS: 20.0000 mg | ORAL_TABLET | Freq: Every day | ORAL | Status: DC

## 2018-05-19 MED ORDER — SODIUM CHLORIDE 0.9 % IV SOLN
2.0000 g | Freq: Once | INTRAVENOUS | Status: AC
  Administered 2018-05-19: 2 g via INTRAVENOUS
  Filled 2018-05-19: qty 20

## 2018-05-19 MED ORDER — ACETAMINOPHEN 650 MG RE SUPP: 650.0000 mg | Freq: Four times a day (QID) | RECTAL | Status: DC | PRN

## 2018-05-19 MED ORDER — SODIUM CHLORIDE 0.9 % IV SOLN
500.0000 mg | Freq: Once | INTRAVENOUS | Status: AC
  Administered 2018-05-19: 500 mg via INTRAVENOUS
  Filled 2018-05-19: qty 500

## 2018-05-19 MED ORDER — SODIUM CHLORIDE 0.9 % IV SOLN: 1.0000 g | INTRAVENOUS | Status: DC

## 2018-05-19 MED ORDER — AZITHROMYCIN 250 MG PO TABS: 500.0000 mg | ORAL_TABLET | ORAL | Status: DC

## 2018-05-19 MED ORDER — FUROSEMIDE 10 MG/ML IJ SOLN: 20.0000 mg | Freq: Two times a day (BID) | INTRAMUSCULAR | Status: DC

## 2018-05-19 MED ORDER — FUROSEMIDE 10 MG/ML IJ SOLN
40.0000 mg | Freq: Once | INTRAMUSCULAR | Status: AC
  Administered 2018-05-19: 40 mg via INTRAVENOUS
  Filled 2018-05-19: qty 4

## 2018-05-19 MED ORDER — INSULIN ASPART 100 UNIT/ML ~~LOC~~ SOLN: 0.0000 [IU] | Freq: Three times a day (TID) | SUBCUTANEOUS | Status: DC

## 2018-05-19 MED ORDER — LOSARTAN POTASSIUM 50 MG PO TABS: 100.0000 mg | ORAL_TABLET | Freq: Every day | ORAL | Status: DC

## 2018-05-19 MED ORDER — ALLOPURINOL 100 MG PO TABS: 200.0000 mg | ORAL_TABLET | Freq: Every day | ORAL | Status: DC

## 2018-05-19 MED ORDER — ISOSORBIDE MONONITRATE ER 30 MG PO TB24: 120.0000 mg | ORAL_TABLET | Freq: Every day | ORAL | Status: DC

## 2018-05-19 MED ORDER — CLOPIDOGREL BISULFATE 75 MG PO TABS: 75.0000 mg | ORAL_TABLET | Freq: Every day | ORAL | Status: DC

## 2018-05-19 MED ORDER — CITALOPRAM HYDROBROMIDE 10 MG PO TABS: 20.0000 mg | ORAL_TABLET | Freq: Every day | ORAL | Status: DC

## 2018-05-19 MED ORDER — NICOTINE 14 MG/24HR TD PT24: 14.0000 mg | MEDICATED_PATCH | Freq: Every day | TRANSDERMAL | Status: DC

## 2018-05-19 MED ORDER — SODIUM CHLORIDE 0.9% FLUSH: 3.0000 mL | Freq: Two times a day (BID) | INTRAVENOUS | Status: DC

## 2018-05-19 NOTE — ED Triage Notes (Signed)
Per [facility]: Pt was sitting in his hotel room when he became short of breath. Pt was speaking in broken sentences when EMS arrived, 96% on room air. Pt given 10 of albuterol, 0.5 of atrovent, and 125 mg of solumedrol with EMS. EMS noted improvement of symptoms. Pt smokes daily 1 pack to 1.5 pack, no known hx of COPD. Pt speaking in full sentences, no accessory muscles used to breathe, pt is 97% on room air, states that he feels much better.

## 2018-05-19 NOTE — H&P (Signed)
History and Physical  Jared Young TFT:[id number] DOB: 11-Jul-1940 DOA: 05/19/2018  PCP: Georgianne Fick, MD   Chief Complaint: Short of breath  HPI:  78 year old man PMH COPD, diabetes presented with increasing shortness of breath.  Referred for admission for pneumonia, COPD exacerbation, suspected heart failure.  Patient reports 3-week history of increasing shortness of breath, without specific aggravating or alleviating factors, now severe.  Reports significant lower extremity edema for the last several weeks and several episodes of chest pain over the last few months.  Ongoing cigarette smoking.  He lives alone.  He has had several falls at home due to loss of balance.  He is suffering from grief at the loss of his wife but denies suicidal ideation.  His breathing is better now after treatment in the emergency department.  ED Course: Treated with Lasix, bronchodilators, ceftriaxone, azithromycin  Review of Systems:  Negative for fever, new visual changes, sore throat, rash, new muscle aches, chest pain today, dysuria, bleeding, n/v/abdominal pain.  Past Medical History:  Diagnosis Date  . Cerebrovascular accident [facility])    Acute right frontoparietal Cortical infarction/  . Diabetes mellitus without complication (HCC)   . Dysphagia    Severe secondary to the stroke  . Hyperlipidemia   . Hypertension   . Myocardial infarct (HCC)   . Nicotine abuse   . PVD (peripheral vascular disease) (HCC)     Past Surgical History:  Procedure Laterality Date  . MRI     and MRA of his brain was done, which confirmed the acute right frontoparietal CVA  . US ECHOCARDIOGRAPHY     EF 55-65%, possibility of regional wall motion abnormality cannot be excluded, grade 1 diastolic dysfunction. No cardiac source of emboli was identified     reports that he has been smoking cigarettes. He has been smoking about 1.00 pack per day. He has never used smokeless tobacco. He reports previous alcohol use. He  reports that he does not use drugs. Mobility: ambulatory  No Known Allergies  Family History  Problem Relation Age of Onset  . Diabetes Mother   . Cancer Mother   . Cancer Father      Prior to Admission medications   Medication Sig Start Date End Date Taking? Authorizing Provider  allopurinol (ZYLOPRIM) 100 MG tablet Take 200 mg by mouth daily.   Yes [provider]  aspirin EC 81 MG tablet Take 81 mg by mouth daily.   Yes [provider]  bisoprolol (ZEBETA) 5 MG tablet Take 5 mg by mouth daily.   Yes [provider]  citalopram (CELEXA) 20 MG tablet Take 20 mg by mouth daily.   Yes [provider]  Clopidogrel Bisulfate (PLAVIX PO) Take by mouth.   Yes [provider]  furosemide (LASIX) 20 MG tablet Take 20 mg by mouth daily.   Yes [provider]  indomethacin (INDOCIN) 50 MG capsule Take 50 mg by mouth 2 (two) times daily with a meal.   Yes [provider]  losartan (COZAAR) 100 MG tablet Take 100 mg by mouth daily.   Yes [provider]  metFORMIN (GLUCOPHAGE-XR) 500 MG 24 hr tablet Take 1,000 mg by mouth 2 (two) times daily.   Yes [provider]  isosorbide mononitrate (IMDUR) 120 MG 24 hr tablet Take 120 mg by mouth daily. 05/07/18   [provider]    Physical Exam: Vitals:   05/19/18 1630 05/19/18 1645  BP: (!) 156/81 (!) 153/66  Pulse: 77 76  Resp: (!) 24 19  SpO2: 92% 92%    Constitutional:   . Appears calm, uncomfortable, nontoxic but ill Eyes:  . pupils and irises appear normal . Normal lids and conjunctivae ENMT:  . Mildly hard of hearing . Lips appear normal Neck:  . neck appears normal, no masses, normal ROM . no thyromegaly Respiratory:  . Bilateral wheezes, some rhonchi, lower pulmonary crackle . Respiratory effort moderately increased.  Cardiovascular:  . RRR, no m/r/g . 3+ LE extremity edema   Abdomen:  . Soft, nontender, nondistended Musculoskeletal:    . Digits/nails BUE: no clubbing, cyanosis, petechiae, infection . RUE, LUE, RLE, LLE   . Moves all extremities, grossly normal tone Skin:  . No rashes, lesions, ulcers . palpation of skin: no induration or nodules Psychiatric:  . Mental status o Mood, affect appropriate . judgment and insight appear intact    I have personally reviewed following labs and imaging studies  Labs:   Venous pH within normal limits  Glucose 216, creatinine 1.44, LFTs unremarkable  Troponin 0.05  CBC unremarkable  Imaging studies:   Chest x-ray independently reviewed shows multifocal infiltrates, pulmonary edema  Medical tests:   EKG independently reviewed: Sinus rhythm, left bundle branch block, anterior septal MI, age unknown, inferior MI, age unknown.  No previous study available for comparison.  Test discussed with performing physician:     Decision to obtain old records:      Review and summation of old records:   Admitted 2012 for acute CVA.  Active Problems:   Acute respiratory failure with hypoxia (HCC)   LBBB (left bundle branch block)   Lobar pneumonia (HCC)   Acute CHF (congestive heart failure) (HCC)   COPD with acute exacerbation (HCC)   Chronic respiratory failure with hypoxia (HCC)   DM type 2 (diabetes mellitus, type 2) (HCC)   Assessment/Plan Acute hypoxic respiratory failure, multifactorial, lobar pneumonia, acute pulmonary edema/CHF, COPD --Treat pneumonia --Diuresis, check echocardiogram, TSH --Treat COPD  New left bundle branch block, elevated troponin --No chest pain today, suspect related to CHF, doubt ACS at this point. Trend troponin --However given new left bundle, will consult cardiology  COPD exacerbation, chronic hypoxic respiratory failure on 2 L nasal cannula --Steroids, bronchodilators  Cigarette smoker --Nicotine patch, strongly recommend cessation  Diabetes mellitus type 2 --Sliding scale insulin.  Hold metformin here.  Grief,  situational depression --Patient denies suicidal ideation, does not appear to be at risk for harm to self or others  PMH stroke  Severity of Illness: The appropriate patient status for this patient is INPATIENT. Inpatient status is judged to be reasonable and necessary in order to provide the required intensity of service to ensure the patient's safety. The patient's presenting symptoms, physical exam findings, and initial radiographic and laboratory data in the context of their chronic comorbidities is felt to place them at high risk for further clinical deterioration. Furthermore, it is not anticipated that the patient will be medically stable for discharge from the hospital within 2 midnights of admission. The following factors support the patient status of inpatient.   " The patient's presenting symptoms include shortness of breath. " The worrisome physical exam findings include peripheral edema, Rales, rhonchi, wheezes. " The initial radiographic and laboratory data are worrisome because of pulmonary edema, multifocal pneumonia, suspected CHF. " The chronic co-morbidities include diabetes.   * I certify that at the point of admission it is my clinical judgment that the patient will require inpatient hospital care spanning beyond 2  midnights from the point of admission due to high intensity of service, high risk for further deterioration and high frequency of surveillance required.*   DVT prophylaxis: enoxaparin Code Status: Full Family Communication:  Consults called: cardiology     Time spent: 4570 minutes  Brendia Sacksaniel Lakely Elmendorf, MD  [facility] Direct contact: see [email or web address]  7PM-7AM contact night coverage as above  05/19/2018, 5:21 PM

## 2018-05-19 NOTE — ED Notes (Signed)
Pt leaving AMA. 

## 2018-05-19 NOTE — ED Notes (Signed)
Cardiology at bedside.

## 2018-05-19 NOTE — ED Notes (Signed)
Pt given urinal.

## 2018-05-19 NOTE — ED Provider Notes (Signed)
[facility] Provider Note   CSN: [id number] Arrival date & time: 05/19/18  1135     History   Chief Complaint Chief Complaint  Patient presents with  . Shortness of Breath  . Suicidal    HPI Jared Young is a 78 y.o. male.  HPI   78 yo M with PMHx CVA, DM, HTN, HLD, COPD w/ smoking abuse here with cough, SOB. Pt reports that over the past 3 weeks, he's had progressively worsening cough, dyspnea, and wheezing. Sx seem worse at night and have been intermittent, though the severity is worsening. Over the past 24 hours, he's had worsenign cough, SOB and today could not catch his breath. Denies any inhaler use. He continues to smoke regularly. Denies any associated pain. No known fevers. He's been producing yellow-green sputum. Denies known fever or chills.  Past Medical History:  Diagnosis Date  . Cerebrovascular accident [facility](HCC)    Acute right frontoparietal Cortical infarction/  . Diabetes mellitus without complication (HCC)   . Dysphagia    Severe secondary to the stroke  . Hyperlipidemia   . Hypertension   . Nicotine abuse   . PVD (peripheral vascular disease) (HCC)     There are no active problems to display for this patient.   Past Surgical History:  Procedure Laterality Date  . MRI     and MRA of his brain was done, which confirmed the acute right frontoparietal CVA  . US ECHOCARDIOGRAPHY     EF 55-65%, possibility of regional wall motion abnormality cannot be excluded, grade 1 diastolic dysfunction. No cardiac source of emboli was identified        Home Medications    Prior to Admission medications   Medication Sig Start Date End Date Taking? Authorizing Provider  allopurinol (ZYLOPRIM) 100 MG tablet Take 200 mg by mouth daily.   Yes [provider]  bisoprolol (ZEBETA) 5 MG tablet Take 5 mg by mouth daily.   Yes [provider]  citalopram (CELEXA) 20 MG tablet Take 20 mg by mouth daily.   Yes  [provider]  furosemide (LASIX) 20 MG tablet Take 20 mg by mouth daily.   Yes [provider]  indomethacin (INDOCIN) 50 MG capsule Take 50 mg by mouth 2 (two) times daily with a meal.   Yes [provider]  losartan (COZAAR) 100 MG tablet Take 100 mg by mouth daily.   Yes [provider]  metFORMIN (GLUCOPHAGE-XR) 500 MG 24 hr tablet Take 1,000 mg by mouth 2 (two) times daily.   Yes [provider]  Clopidogrel Bisulfate (PLAVIX PO) Take by mouth.    [provider]  isosorbide mononitrate (IMDUR) 120 MG 24 hr tablet Take 120 mg by mouth daily. 05/07/18   [provider]    Family History No family history on file.  Social History Social History   Tobacco Use  . Smoking status: Current Every Day Smoker    Packs/day: 1.00    Types: Cigarettes  Substance Use Topics  . Alcohol use: Not on file  . Drug use: Never     Allergies   Patient has no known allergies.   Review of Systems Review of Systems  Constitutional: Positive for fatigue. Negative for chills, diaphoresis and fever.  HENT: Negative for congestion and rhinorrhea.   Eyes: Negative for visual disturbance.  Respiratory: Positive for cough, shortness of breath and wheezing.   Cardiovascular: Positive for leg swelling. Negative for chest  pain.  Gastrointestinal: Negative for abdominal pain, diarrhea, nausea and vomiting.  Genitourinary: Negative for dysuria and flank pain.  Musculoskeletal: Negative for neck pain and neck stiffness.  Skin: Negative for rash and wound.  Allergic/Immunologic: Negative for immunocompromised state.  Neurological: Negative for syncope, weakness and headaches.  Psychiatric/Behavioral: Positive for dysphoric mood.  All other systems reviewed and are negative.    Physical Exam Updated Vital Signs BP (!) 166/72   Pulse 77   Resp 20   SpO2 91%   Physical Exam Vitals signs and nursing note reviewed.  Constitutional:       General: He is not in acute distress.    Appearance: He is well-developed.     Comments: Elderly male in mild resp distress  HENT:     Head: Normocephalic and atraumatic.  Eyes:     Conjunctiva/sclera: Conjunctivae normal.  Neck:     Musculoskeletal: Neck supple.  Cardiovascular:     Rate and Rhythm: Normal rate and regular rhythm.     Heart sounds: Normal heart sounds. No murmur. No friction rub.  Pulmonary:     Effort: Pulmonary effort is normal. Tachypnea present. No respiratory distress.     Breath sounds: Examination of the right-middle field reveals rales. Examination of the left-middle field reveals rales. Examination of the right-lower field reveals rales. Examination of the left-lower field reveals rales. Decreased breath sounds, wheezing and rales present.  Abdominal:     General: There is no distension.     Palpations: Abdomen is soft.     Tenderness: There is no abdominal tenderness.  Musculoskeletal:     Right lower leg: Edema present.     Left lower leg: Edema present.  Skin:    General: Skin is warm.     Capillary Refill: Capillary refill takes less than 2 seconds.  Neurological:     Mental Status: He is alert and oriented to person, place, and time.     Motor: No abnormal muscle tone.  Psychiatric:     Comments: Endorses dysphoric mood since passing of wife, but on my interview, denies any SI, HI, AVH.      ED Treatments / Results  Labs (all labs ordered are listed, but only abnormal results are displayed) Labs Reviewed  CBC WITH DIFFERENTIAL/PLATELET - Abnormal; Notable for the following components:      Result Value   HCT 38.3 (*)    nRBC 12.6 (*)    Lymphs Abs 0.4 (*)    Abs Immature Granulocytes 0.12 (*)    All other components within normal limits  COMPREHENSIVE METABOLIC PANEL - Abnormal; Notable for the following components:   Glucose, Bld 216 (*)    Creatinine, Ser 1.44 (*)    Albumin 2.9 (*)    GFR calc non Af Amer 46 (*)    GFR calc Af  Amer 54 (*)    All other components within normal limits  TROPONIN I - Abnormal; Notable for the following components:   Troponin I 0.05 (*)    All other components within normal limits  POCT I-STAT EG7 - Abnormal; Notable for the following components:   pCO2, Ven 63.3 (*)    pO2, Ven 19.0 (*)    Bicarbonate 32.8 (*)    TCO2 35 (*)    Acid-Base Excess 4.0 (*)    HCT 63.0 (*)    Hemoglobin 21.4 (*)    All other components within normal limits  CULTURE, BLOOD (ROUTINE X 2)  CULTURE, BLOOD (ROUTINE X 2)  BRAIN NATRIURETIC PEPTIDE  BLOOD GAS, VENOUS    EKG EKG Interpretation  Date/Time:  Wednesday May 19 2018 11:47:57 EST Ventricular Rate:  74 PR Interval:    QRS Duration: 164 QT Interval:  464 QTC Calculation: 515 R Axis:   -84 Text Interpretation:  Sinus rhythm Left bundle branch block Baseline wander in lead(s) V1 Since last EKG, LBBB is new No Sgarbossa criteria Confirmed by Shaune PollackIsaacs, Lief Palmatier [phone]) on 05/19/2018 12:17:52 PM   Radiology Dg Chest 2 View  Result Date: 05/19/2018 CLINICAL DATA:  78 year old male with a history of shortness of breath for 3 weeks EXAM: CHEST - 2 VIEW COMPARISON:  06/22/2010 FINDINGS: Cardiomediastinal silhouette likely unchanged. Right heart border partially obscured by overlying lung/pleural disease. Mixed interstitial and airspace opacities at the bilateral mid and lower lungs worst on the right. Blunting of the right costophrenic angle with blunting of the costophrenic sulcus on the lateral view. IMPRESSION: Mixed interstitial and airspace opacities of the bilateral lungs, potentially representing edema and/or multifocal infection. Associated small pleural effusions, larger on the right. Correlation with presentation and lab values may be useful. Electronically Signed   By: Gilmer MorJaime  Wagner D.O.   On: 05/19/2018 13:07    Procedures .Critical Care Performed by: Shaune PollackIsaacs, Darina Hartwell, MD Authorized by: Shaune PollackIsaacs, Sulaiman Imbert, MD   Critical care provider  statement:    Critical care time (minutes):  35   Critical care time was exclusive of:  Separately billable procedures and treating other patients and teaching time   Critical care was necessary to treat or prevent imminent or life-threatening deterioration of the following conditions:  Cardiac failure, circulatory failure and respiratory failure   Critical care was time spent personally by me on the following activities:  Development of treatment plan with patient or surrogate, discussions with consultants, evaluation of patient's response to treatment, examination of patient, obtaining history from patient or surrogate, ordering and performing treatments and interventions, ordering and review of laboratory studies, ordering and review of radiographic studies, pulse oximetry, re-evaluation of patient's condition and review of old charts   I assumed direction of critical care for this patient from another provider in my specialty: no     (including critical care time)  Medications Ordered in ED Medications  azithromycin (ZITHROMAX) 500 mg in sodium chloride 0.9 % 250 mL IVPB (500 mg Intravenous New Bag/Given 05/19/18 1551)  ipratropium-albuterol (DUONEB) 0.5-2.5 (3) MG/3ML nebulizer solution 3 mL (has no administration in time range)  ipratropium-albuterol (DUONEB) 0.5-2.5 (3) MG/3ML nebulizer solution 3 mL (3 mLs Nebulization Given 05/19/18 1400)  cefTRIAXone (ROCEPHIN) 2 g in sodium chloride 0.9 % 100 mL IVPB (0 g Intravenous Stopped 05/19/18 1515)  furosemide (LASIX) injection 40 mg (40 mg Intravenous Given 05/19/18 1441)     Initial Impression / Assessment and Plan / ED Course  I have reviewed the triage vital signs and the nursing notes.  Pertinent labs & imaging results that were available during my care of the patient were reviewed by me and considered in my medical decision making (see chart for details).     78 year old male here with dyspnea, worsening over the last several weeks but  particularly worse over the last 24 hours.  On exam, he has diffuse rales, appears overtly hypervolemic, but also diffuse wheezing.  He is mildly hypoxic here.  Troponin elevated, but BNP is normal.  He denies any chest pain and EKG is nonischemic.  Chest x-ray shows significant edema with effusions, versus atypical pneumonia.  Given his history of COPD  and wheezing, patient was given steroids, breathing treatments, and antibiotics.  However, I suspect there may also be a component of CHF despite a normal BNP.  Dose of Lasix trialed here.  Will admit for persistent respiratory distress and hypoxia.  OF NOTE, pt endorsed passive SI to RN. He is denying this adamantly on my exam. He admits to feeling hopeless since passing of his wife but is awake, alert, denies any SI, HI to me. May benefit from TTS consult as inpt if amenable, but refuses at this time.  Final Clinical Impressions(s) / ED Diagnoses   Final diagnoses:  COPD exacerbation [facility])  Respiratory distress    ED Discharge Orders    None       Shaune Pollack, MD 05/19/18 1553

## 2018-05-24 LAB — CULTURE, BLOOD (ROUTINE X 2)
CULTURE: NO GROWTH
Culture: NO GROWTH
Special Requests: ADEQUATE
Special Requests: ADEQUATE

## 2018-06-08 DIAGNOSIS — E782 Mixed hyperlipidemia: Secondary | ICD-10-CM | POA: Diagnosis not present

## 2018-06-08 DIAGNOSIS — E1165 Type 2 diabetes mellitus with hyperglycemia: Secondary | ICD-10-CM | POA: Diagnosis not present

## 2018-06-08 DIAGNOSIS — I10 Essential (primary) hypertension: Secondary | ICD-10-CM | POA: Diagnosis not present

## 2018-06-08 DIAGNOSIS — R0609 Other forms of dyspnea: Secondary | ICD-10-CM | POA: Diagnosis not present

## 2018-06-09 DIAGNOSIS — R0602 Shortness of breath: Secondary | ICD-10-CM | POA: Diagnosis not present

## 2018-06-09 DIAGNOSIS — I5023 Acute on chronic systolic (congestive) heart failure: Secondary | ICD-10-CM | POA: Diagnosis not present

## 2018-06-14 DIAGNOSIS — R0602 Shortness of breath: Secondary | ICD-10-CM | POA: Diagnosis not present

## 2018-06-14 DIAGNOSIS — I5023 Acute on chronic systolic (congestive) heart failure: Secondary | ICD-10-CM | POA: Diagnosis not present

## 2018-06-17 DIAGNOSIS — R0602 Shortness of breath: Secondary | ICD-10-CM | POA: Diagnosis not present

## 2018-06-17 DIAGNOSIS — I5023 Acute on chronic systolic (congestive) heart failure: Secondary | ICD-10-CM | POA: Diagnosis not present

## 2018-06-29 DIAGNOSIS — I5023 Acute on chronic systolic (congestive) heart failure: Secondary | ICD-10-CM | POA: Diagnosis not present

## 2018-07-01 DIAGNOSIS — I5023 Acute on chronic systolic (congestive) heart failure: Secondary | ICD-10-CM | POA: Diagnosis not present

## 2018-07-01 DIAGNOSIS — N183 Chronic kidney disease, stage 3 (moderate): Secondary | ICD-10-CM | POA: Diagnosis not present

## 2018-07-01 DIAGNOSIS — R600 Localized edema: Secondary | ICD-10-CM | POA: Diagnosis not present

## 2018-08-18 DIAGNOSIS — E789 Disorder of lipoprotein metabolism, unspecified: Secondary | ICD-10-CM | POA: Diagnosis not present

## 2018-08-18 DIAGNOSIS — Z0000 Encounter for general adult medical examination without abnormal findings: Secondary | ICD-10-CM | POA: Diagnosis not present

## 2018-08-18 DIAGNOSIS — Z789 Other specified health status: Secondary | ICD-10-CM | POA: Diagnosis not present

## 2018-08-18 DIAGNOSIS — Z8673 Personal history of transient ischemic attack (TIA), and cerebral infarction without residual deficits: Secondary | ICD-10-CM | POA: Diagnosis not present

## 2018-08-18 DIAGNOSIS — E1165 Type 2 diabetes mellitus with hyperglycemia: Secondary | ICD-10-CM | POA: Diagnosis not present

## 2018-08-18 DIAGNOSIS — J449 Chronic obstructive pulmonary disease, unspecified: Secondary | ICD-10-CM | POA: Diagnosis not present

## 2018-08-18 DIAGNOSIS — E782 Mixed hyperlipidemia: Secondary | ICD-10-CM | POA: Diagnosis not present

## 2018-08-18 DIAGNOSIS — E1151 Type 2 diabetes mellitus with diabetic peripheral angiopathy without gangrene: Secondary | ICD-10-CM | POA: Diagnosis not present

## 2018-08-18 DIAGNOSIS — I739 Peripheral vascular disease, unspecified: Secondary | ICD-10-CM | POA: Diagnosis not present

## 2018-09-21 ENCOUNTER — Emergency Department (HOSPITAL_COMMUNITY)
Admission: EM | Admit: 2018-09-21 | Discharge: 2018-09-27 | Disposition: E | Payer: Medicare Other | Attending: Emergency Medicine | Admitting: Emergency Medicine

## 2018-09-21 ENCOUNTER — Encounter (HOSPITAL_COMMUNITY): Payer: Self-pay | Admitting: Emergency Medicine

## 2018-09-21 DIAGNOSIS — I469 Cardiac arrest, cause unspecified: Secondary | ICD-10-CM | POA: Insufficient documentation

## 2018-09-21 DIAGNOSIS — Z7982 Long term (current) use of aspirin: Secondary | ICD-10-CM | POA: Diagnosis not present

## 2018-09-21 DIAGNOSIS — I252 Old myocardial infarction: Secondary | ICD-10-CM | POA: Insufficient documentation

## 2018-09-21 DIAGNOSIS — Z7984 Long term (current) use of oral hypoglycemic drugs: Secondary | ICD-10-CM | POA: Insufficient documentation

## 2018-09-21 DIAGNOSIS — F17210 Nicotine dependence, cigarettes, uncomplicated: Secondary | ICD-10-CM | POA: Diagnosis not present

## 2018-09-21 DIAGNOSIS — R0689 Other abnormalities of breathing: Secondary | ICD-10-CM | POA: Diagnosis not present

## 2018-09-21 DIAGNOSIS — R000 Tachycardia, unspecified: Secondary | ICD-10-CM | POA: Diagnosis not present

## 2018-09-21 DIAGNOSIS — I499 Cardiac arrhythmia, unspecified: Secondary | ICD-10-CM | POA: Diagnosis not present

## 2018-09-21 DIAGNOSIS — Z79899 Other long term (current) drug therapy: Secondary | ICD-10-CM | POA: Diagnosis not present

## 2018-09-21 DIAGNOSIS — J449 Chronic obstructive pulmonary disease, unspecified: Secondary | ICD-10-CM | POA: Insufficient documentation

## 2018-09-21 DIAGNOSIS — I4901 Ventricular fibrillation: Secondary | ICD-10-CM | POA: Diagnosis not present

## 2018-09-21 DIAGNOSIS — R404 Transient alteration of awareness: Secondary | ICD-10-CM | POA: Diagnosis not present

## 2018-09-21 DIAGNOSIS — E119 Type 2 diabetes mellitus without complications: Secondary | ICD-10-CM | POA: Diagnosis not present

## 2018-09-21 MED ORDER — SODIUM BICARBONATE 8.4 % IV SOLN
INTRAVENOUS | Status: DC | PRN
  Administered 2018-09-21: 50 meq via INTRAVENOUS

## 2018-09-21 MED ORDER — EPINEPHRINE 1 MG/10ML IJ SOSY
PREFILLED_SYRINGE | INTRAMUSCULAR | Status: DC | PRN
  Administered 2018-09-21: 1 mg via INTRAVENOUS

## 2018-09-21 MED FILL — Medication: Qty: 1 | Status: AC

## 2018-09-27 NOTE — Code Documentation (Signed)
Pt went to [employer] at 11, per EMS. Then pt's son found him prone on the ground 11:30, started CPR. Fire arrived 1144 continued CPR and shocked once. EMS arrived shocked 3 more times- Vfib on the monitor. EMS gave total of 5 epi, 450mg  Amiodarone. CBG 223. Per EMS, pt's family reports he hasn't been feeling well.

## 2018-09-27 NOTE — ED Notes (Signed)
Family placed in Consultation Rm A

## 2018-09-27 NOTE — ED Notes (Signed)
Pt's sister, Junious Dresser is going to take pt's two rings, watch and medications home

## 2018-09-27 NOTE — Code Documentation (Signed)
Patient time of death occurred at 1236. °

## 2018-09-27 NOTE — ED Notes (Signed)
Chaplain paged for pt's family @ 1255. Ray returned page @ 1255 and stated he will be down.

## 2018-09-27 NOTE — ED Provider Notes (Signed)
[facility] EMERGENCY DEPARTMENT Provider Note   CSN: [id number] Arrival date & time: Sep 29, 2018  1230    History   Chief Complaint Chief Complaint  Patient presents with  . Cardiac Arrest    HPI Jared Young is a 78 y.o. male.  HPI   16yM with cardiac arrest. Unclear exact circumstances. Son talked to him 20-30 minutes prior to finding him unresponsive in his home. No reported trauma. vfib on [employer] arrival. 450 mg amiodarone, epi x5, attempted cardioversion. ~40 minutes of CPR prior to arriving to the ER. Glucose 200s.   Past Medical History:  Diagnosis Date  . Cerebrovascular accident [facility])    Acute right frontoparietal Cortical infarction/  . Diabetes mellitus without complication ([place])   . Dysphagia    Severe secondary to the stroke  . Hyperlipidemia   . Hypertension   . Myocardial infarct ([place])   . Nicotine abuse   . PVD (peripheral vascular disease) [facility])     Patient Active Problem List   Diagnosis Date Noted  . Acute respiratory failure with hypoxia ([place]) 05/19/2018  . LBBB (left bundle branch block) 05/19/2018  . Lobar pneumonia ([place]) 05/19/2018  . Acute CHF (congestive heart failure) ([place]) 05/19/2018  . COPD with acute exacerbation ([place]) 05/19/2018  . Chronic respiratory failure with hypoxia ([place]) 05/19/2018  . DM type 2 (diabetes mellitus, type 2) ([place]) 05/19/2018    Past Surgical History:  Procedure Laterality Date  . MRI     and MRA of his brain was done, which confirmed the acute right frontoparietal CVA  . US ECHOCARDIOGRAPHY     EF 55-65%, possibility of regional wall motion abnormality cannot be excluded, grade 1 diastolic dysfunction. No cardiac source of emboli was identified        Home Medications    Prior to Admission medications   Medication Sig Start Date End Date Taking? Authorizing Provider  allopurinol (ZYLOPRIM) 100 MG tablet Take 200 mg by mouth daily.    [provider]  aspirin EC 81 MG  tablet Take 81 mg by mouth daily.    [provider]  bisoprolol (ZEBETA) 5 MG tablet Take 5 mg by mouth daily.    [provider]  citalopram (CELEXA) 20 MG tablet Take 20 mg by mouth daily.    [provider]  Clopidogrel Bisulfate (PLAVIX PO) Take by mouth.    [provider]  furosemide (LASIX) 20 MG tablet Take 20 mg by mouth daily.    [provider]  indomethacin (INDOCIN) 50 MG capsule Take 50 mg by mouth 2 (two) times daily with a meal.    [provider]  isosorbide mononitrate (IMDUR) 120 MG 24 hr tablet Take 120 mg by mouth daily. 05/07/18   [provider]  losartan (COZAAR) 100 MG tablet Take 100 mg by mouth daily.    [provider]  metFORMIN (GLUCOPHAGE-XR) 500 MG 24 hr tablet Take 1,000 mg by mouth 2 (two) times daily.    [provider]    Family History Family History  Problem Relation Age of Onset  . Diabetes Mother   . Cancer Mother   . Cancer Father     Social History Social History   Tobacco Use  . Smoking status: Current Every Day Smoker    Packs/day: 1.00    Types: Cigarettes  . Smokeless tobacco: Never Used  Substance Use Topics  . Alcohol use: Not Currently  . Drug use: Never  Allergies   Patient has no known allergies.   Review of Systems Review of Systems  Level 5 caveat because pt is unresponsive.  Physical Exam Updated Vital Signs Ht '5\' 11"'  (1.803 m)   Wt 113.4 kg   BMI 34.87 kg/m   Physical Exam Vitals signs and nursing note reviewed.  Constitutional:      General: He is not in acute distress.    Appearance: He is well-developed. He is ill-appearing and toxic-appearing.  HENT:     Head: Normocephalic.  Eyes:     General:        Right eye: No discharge.        Left eye: No discharge.     Conjunctiva/sclera: Conjunctivae normal.     Comments: Pupils ~44m, symmetric, non-reactive  Neck:     Musculoskeletal: Neck supple.  Cardiovascular:      Comments: Compressions via LUCAS device. No pulses on checks. Bedside [place] no discernable cardiac activity. No pericardial effusion.  Pulmonary:     Effort: Respiratory distress present.     Comments: [place] in place. Bagged easily. B/l breath sounds.  Abdominal:     General: There is no distension.     Palpations: Abdomen is soft.     Tenderness: There is no abdominal tenderness.  Musculoskeletal:        General: No tenderness.     Comments: IO L tibia  Skin:    General: Skin is warm and dry.  Neurological:     Comments: GCS 3T      ED Treatments / Results  Labs (all labs ordered are listed, but only abnormal results are displayed) Labs Reviewed - No data to display  EKG None  Radiology No results found.  Procedures Procedures (including critical care time)  Cardiopulmonary Resuscitation (CPR) Procedure Note Directed/Performed by: SVirgel ManifoldI personally directed ancillary staff and/or performed CPR in an effort to regain return of spontaneous circulation and to maintain cardiac, neuro and systemic perfusion.   CRITICAL CARE Performed by: SVirgel ManifoldTotal critical care time: 35 minutes Critical care time was exclusive of separately billable procedures and treating other patients. Critical care was necessary to treat or prevent imminent or life-threatening deterioration. Critical care was time spent personally by me on the following activities: development of treatment plan with patient and/or surrogate as well as nursing, discussions with consultants, evaluation of patient's response to treatment, examination of patient, obtaining history from patient or surrogate, ordering and performing treatments and interventions, ordering and review of laboratory studies, ordering and review of radiographic studies, pulse oximetry and re-evaluation of patient's condition.   Medications Ordered in ED Medications  EPINEPHrine (ADRENALIN) 1 MG/10ML injection (1 mg  Intravenous Given 5Jun 08, 20201232)  sodium bicarbonate injection (50 mEq Intravenous Given [id number])     Initial Impression / Assessment and Plan / ED Course  I have reviewed the triage vital signs and the nursing notes.  Pertinent labs & imaging results that were available during my care of the patient were reviewed by me and considered in my medical decision making (see chart for details).  77yM with unwitnessed arrest. 50+ minutes of CPR. Unfortunately, essentially zero chance of having a meaningful outcome. Time of death 1March 18, 1236  I met with family. Pt has been saying he has no further will to live since the death of his wife a few years ago. Will not be a ME case.   Final Clinical Impressions(s) / ED Diagnoses   Final diagnoses:  Cardiac  arrest [facility])    ED Discharge Orders    None       Virgel Manifold, MD Sep 22, 2018 1321

## 2018-09-27 NOTE — Code Documentation (Signed)
Family updated as to patient's status.

## 2018-09-27 NOTE — ED Notes (Signed)
IO placed by EMS in left shin removed by NT

## 2018-09-27 NOTE — ED Notes (Signed)
Pt's son Karandeep Haskill [phone] Pt's sis Janine Limbo [phone]

## 2018-09-27 NOTE — ED Notes (Signed)
Pt's family at the bedside. 

## 2018-09-27 NOTE — Code Documentation (Signed)
Rhythm check, no pulse, PEA

## 2018-09-27 DEATH — deceased

## 2019-11-03 IMAGING — DX DG CHEST 2V
2 series · 2 of 2 positions shown · non-contrast
Comparison: 06/22/2010

CLINICAL DATA: 77-year-old male with a history of shortness of
breath for 3 weeks

EXAM:
CHEST - 2 VIEW

[x chest ap]
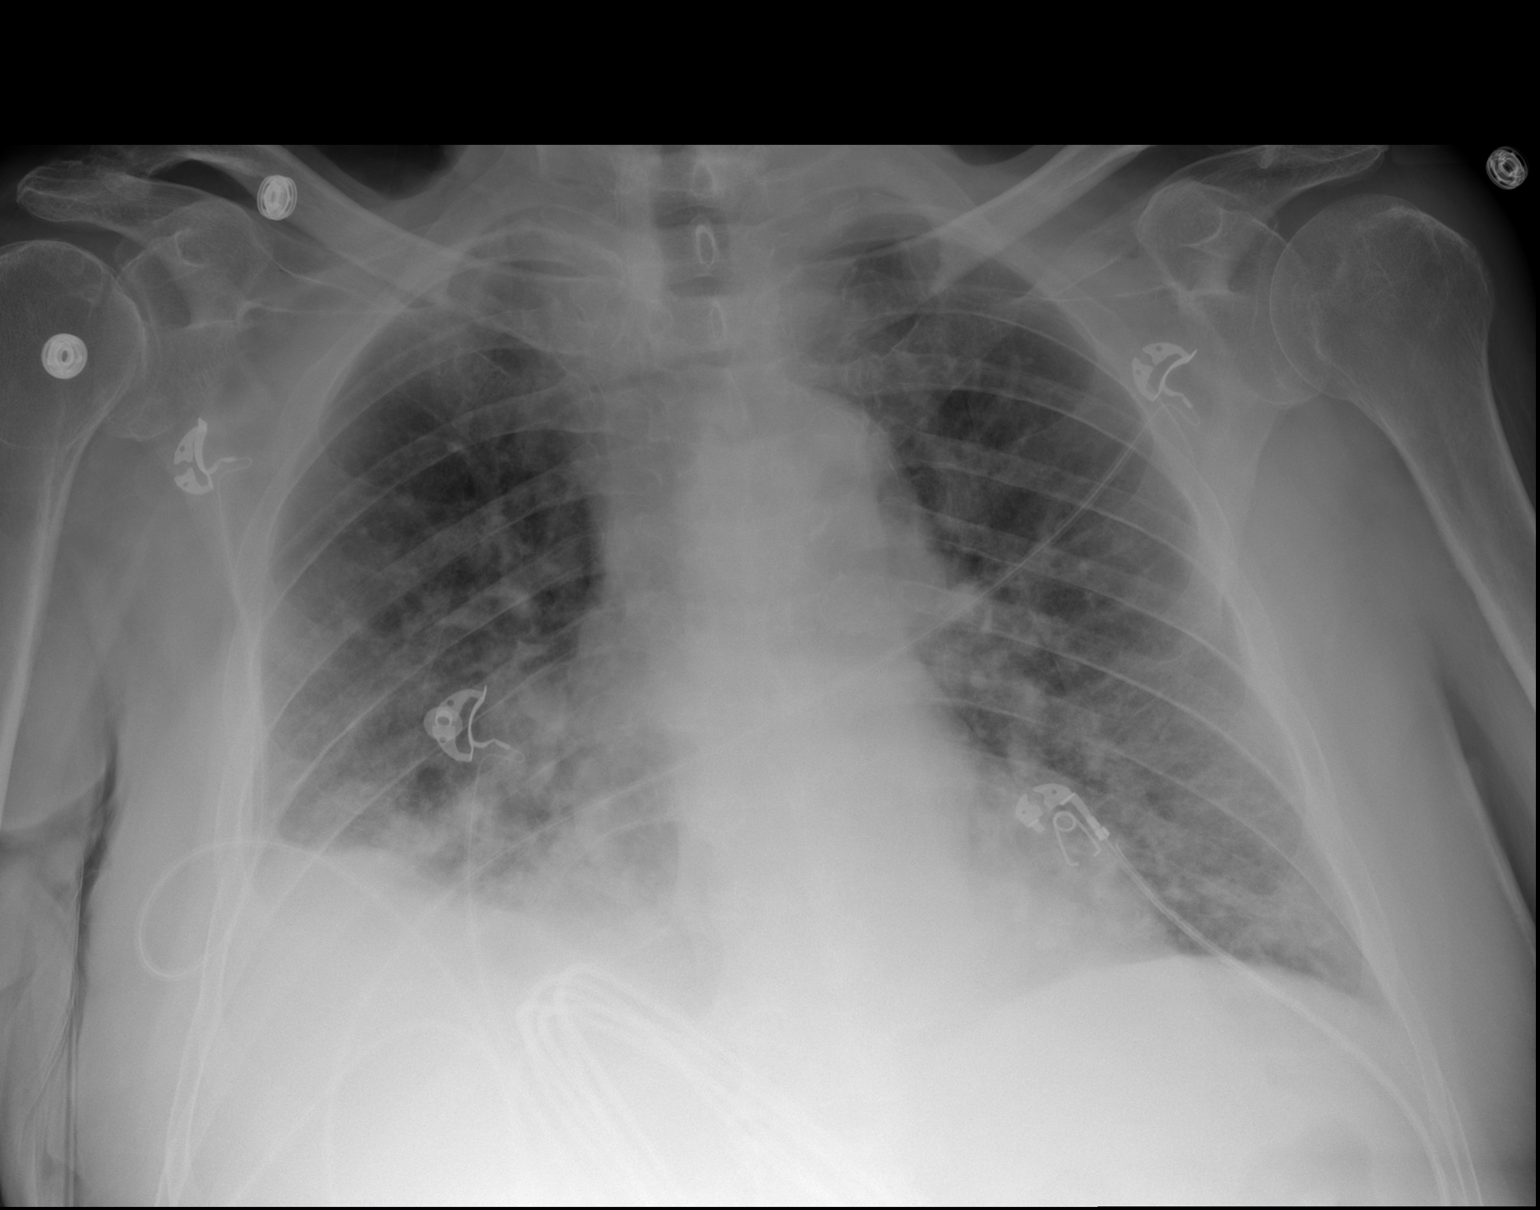

[w chest lat]
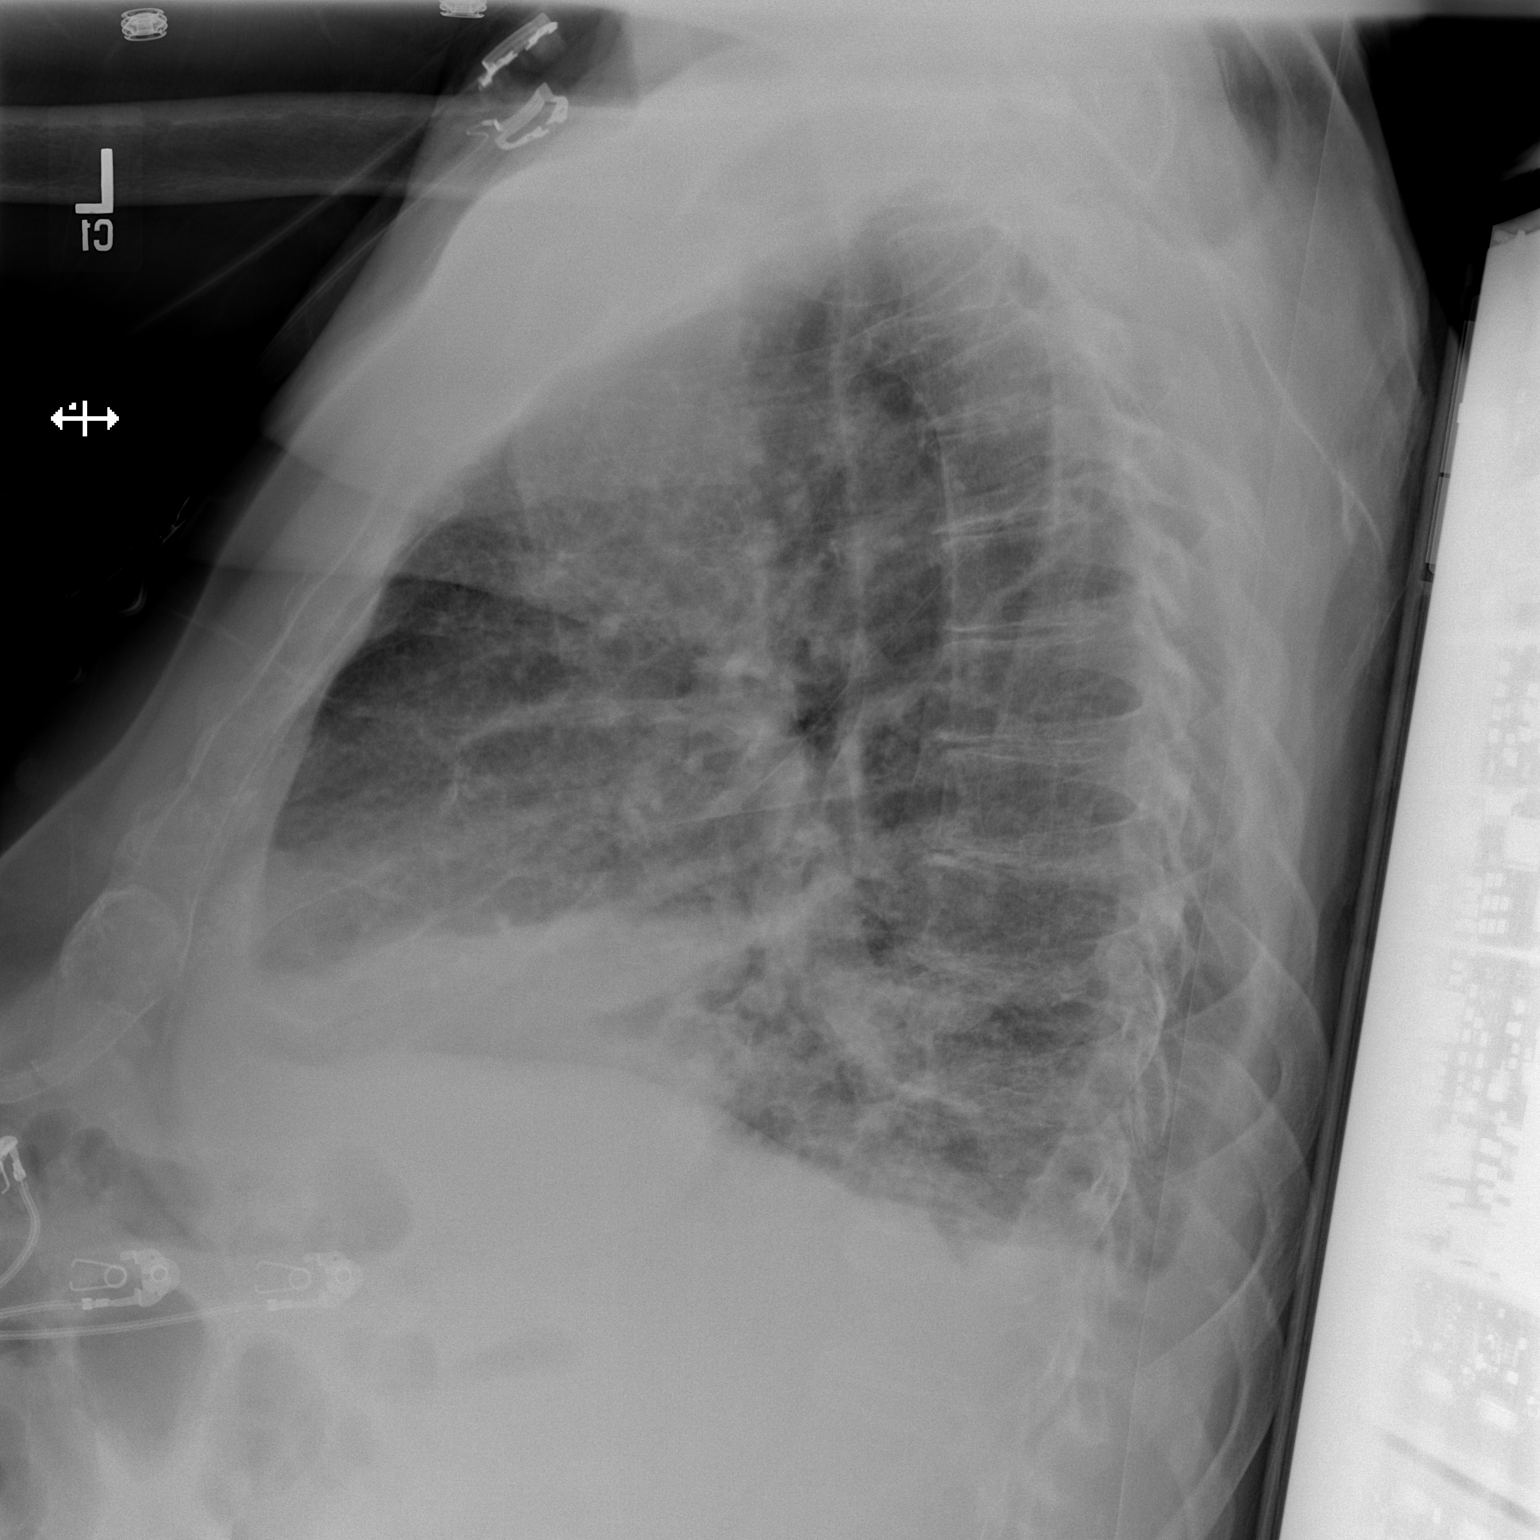

[2 of 2 positions shown; findings below may reference images not displayed]

FINDINGS: Cardiomediastinal silhouette likely unchanged. Right heart border
partially obscured by overlying lung/pleural disease. Mixed
interstitial and airspace opacities at the bilateral mid and lower
lungs worst on the right. Blunting of the right costophrenic angle
with blunting of the costophrenic sulcus on the lateral view.
IMPRESSION: Mixed interstitial and airspace opacities of the bilateral lungs,
potentially representing edema and/or multifocal infection.
Associated small pleural effusions, larger on the right. Correlation
with presentation and lab values may be useful.
